# Patient Record
Sex: Female | Born: 1960
Health system: Southern US, Community
[De-identification: ages and names within clinical notes are randomized; demographics above are authoritative.]

## PROBLEM LIST (undated history)

## (undated) DIAGNOSIS — E079 Disorder of thyroid, unspecified: Secondary | ICD-10-CM

## (undated) DIAGNOSIS — F329 Major depressive disorder, single episode, unspecified: Secondary | ICD-10-CM

## (undated) DIAGNOSIS — F4322 Adjustment disorder with anxiety: Secondary | ICD-10-CM

## (undated) DIAGNOSIS — J309 Allergic rhinitis, unspecified: Secondary | ICD-10-CM

## (undated) DIAGNOSIS — D6489 Other specified anemias: Secondary | ICD-10-CM

## (undated) HISTORY — DX: Disorder of thyroid, unspecified: E07.9

## (undated) HISTORY — DX: Major depressive disorder, single episode, unspecified: F32.9

## (undated) HISTORY — DX: Allergic rhinitis, unspecified: J30.9

## (undated) HISTORY — PX: NASAL SEPTUM SURGERY: SHX37

## (undated) HISTORY — DX: Other specified anemias: D64.89

## (undated) HISTORY — DX: Adjustment disorder with anxiety: F43.22

---

## 2006-04-21 ENCOUNTER — Ambulatory Visit: Payer: Self-pay | Admitting: Internal Medicine

## 2006-04-21 LAB — CONVERTED CEMR LAB
ALT: 33 units/L (ref 0–40)
AST: 29 units/L (ref 0–37)
Albumin: 4.1 g/dL (ref 3.5–5.2)
Alkaline Phosphatase: 56 units/L (ref 39–117)
Basophils Relative: 0.2 % (ref 0.0–1.0)
GFR calc non Af Amer: 86 mL/min
Glomerular Filtration Rate, Af Am: 104 mL/min/{1.73_m2}
Glucose, Bld: 97 mg/dL (ref 70–99)
HCT: 37.9 % — ABNORMAL LOW (ref 39.0–52.0)
LDL Cholesterol: 79 mg/dL (ref 0–99)
MCV: 91.8 fL (ref 78.0–100.0)
Monocytes Absolute: 0.5 10*3/uL (ref 0.2–0.7)
Platelets: 313 10*3/uL (ref 150–400)
Potassium: 3.8 meq/L (ref 3.5–5.1)
RBC: 4.13 M/uL — ABNORMAL LOW (ref 4.22–5.81)
RDW: 11.7 % (ref 11.5–14.6)
Sodium: 133 meq/L — ABNORMAL LOW (ref 135–145)
Total Bilirubin: 1.1 mg/dL (ref 0.3–1.2)
Total Protein: 6.9 g/dL (ref 6.0–8.3)
VLDL: 16 mg/dL (ref 0–40)
WBC: 8.4 10*3/uL (ref 4.5–10.5)

## 2006-05-04 ENCOUNTER — Ambulatory Visit: Payer: Self-pay | Admitting: Internal Medicine

## 2006-06-29 ENCOUNTER — Ambulatory Visit: Payer: Self-pay | Admitting: Internal Medicine

## 2006-08-27 ENCOUNTER — Encounter: Admission: RE | Admit: 2006-08-27 | Discharge: 2006-08-27 | Payer: Self-pay | Admitting: Obstetrics and Gynecology

## 2006-12-16 ENCOUNTER — Ambulatory Visit: Payer: Self-pay | Admitting: Internal Medicine

## 2006-12-16 DIAGNOSIS — F329 Major depressive disorder, single episode, unspecified: Secondary | ICD-10-CM | POA: Insufficient documentation

## 2006-12-16 DIAGNOSIS — F3289 Other specified depressive episodes: Secondary | ICD-10-CM

## 2006-12-16 DIAGNOSIS — M549 Dorsalgia, unspecified: Secondary | ICD-10-CM | POA: Insufficient documentation

## 2006-12-16 DIAGNOSIS — D6489 Other specified anemias: Secondary | ICD-10-CM | POA: Insufficient documentation

## 2006-12-16 HISTORY — DX: Other specified anemias: D64.89

## 2006-12-16 HISTORY — DX: Other specified depressive episodes: F32.89

## 2006-12-16 HISTORY — DX: Major depressive disorder, single episode, unspecified: F32.9

## 2006-12-16 LAB — CONVERTED CEMR LAB
Basophils Relative: 0.8 % (ref 0.0–1.0)
Eosinophils Relative: 2.1 % (ref 0.0–5.0)
Hemoglobin: 14.2 g/dL (ref 13.0–17.0)
Monocytes Absolute: 0.8 10*3/uL — ABNORMAL HIGH (ref 0.2–0.7)
Monocytes Relative: 5.2 % (ref 3.0–11.0)
Platelets: 347 10*3/uL (ref 150–400)
RDW: 12.1 % (ref 11.5–14.6)
WBC: 14.5 10*3/uL — ABNORMAL HIGH (ref 4.5–10.5)

## 2007-08-24 ENCOUNTER — Ambulatory Visit: Payer: Self-pay | Admitting: Internal Medicine

## 2007-08-24 DIAGNOSIS — F4322 Adjustment disorder with anxiety: Secondary | ICD-10-CM

## 2007-08-24 HISTORY — DX: Adjustment disorder with anxiety: F43.22

## 2007-09-09 ENCOUNTER — Encounter: Admission: RE | Admit: 2007-09-09 | Discharge: 2007-09-09 | Payer: Self-pay | Admitting: Obstetrics and Gynecology

## 2008-06-27 ENCOUNTER — Ambulatory Visit: Payer: Self-pay | Admitting: Internal Medicine

## 2008-06-27 LAB — CONVERTED CEMR LAB: Rapid Strep: NEGATIVE

## 2008-06-30 ENCOUNTER — Telehealth: Payer: Self-pay | Admitting: Internal Medicine

## 2008-10-11 ENCOUNTER — Encounter: Admission: RE | Admit: 2008-10-11 | Discharge: 2008-10-11 | Payer: Self-pay | Admitting: Obstetrics and Gynecology

## 2009-01-11 ENCOUNTER — Ambulatory Visit: Payer: Self-pay | Admitting: Internal Medicine

## 2009-01-11 DIAGNOSIS — R071 Chest pain on breathing: Secondary | ICD-10-CM

## 2009-07-09 ENCOUNTER — Ambulatory Visit: Payer: Self-pay | Admitting: Internal Medicine

## 2009-07-09 LAB — CONVERTED CEMR LAB
Albumin: 4.4 g/dL (ref 3.5–5.2)
Alkaline Phosphatase: 59 units/L (ref 39–117)
Basophils Absolute: 0.1 10*3/uL (ref 0.0–0.1)
Bilirubin, Direct: 0 mg/dL (ref 0.0–0.3)
Blood in Urine, dipstick: NEGATIVE
CO2: 27 meq/L (ref 19–32)
Calcium: 9 mg/dL (ref 8.4–10.5)
Creatinine, Ser: 1.1 mg/dL (ref 0.4–1.2)
Eosinophils Absolute: 0.8 10*3/uL — ABNORMAL HIGH (ref 0.0–0.7)
Glucose, Bld: 92 mg/dL (ref 70–99)
Glucose, Urine, Semiquant: NEGATIVE
HDL: 49.9 mg/dL (ref >39.00)
Ketones, urine, test strip: NEGATIVE
Lymphocytes Relative: 22.8 % (ref 12.0–46.0)
MCHC: 33.6 g/dL (ref 30.0–36.0)
Neutro Abs: 8.5 10*3/uL — ABNORMAL HIGH (ref 1.4–7.7)
Neutrophils Relative %: 66 % (ref 43.0–77.0)
RDW: 11.9 % (ref 11.5–14.6)
Specific Gravity, Urine: <1.005
Triglycerides: 188 mg/dL — ABNORMAL HIGH (ref 0.0–149.0)
pH: 5.5

## 2009-07-13 ENCOUNTER — Ambulatory Visit: Payer: Self-pay | Admitting: Internal Medicine

## 2009-07-13 DIAGNOSIS — J309 Allergic rhinitis, unspecified: Secondary | ICD-10-CM

## 2009-07-13 DIAGNOSIS — D649 Anemia, unspecified: Secondary | ICD-10-CM | POA: Insufficient documentation

## 2009-07-13 HISTORY — DX: Allergic rhinitis, unspecified: J30.9

## 2010-06-04 NOTE — Assessment & Plan Note (Signed)
Summary: CPX // RS   Vital Signs:  Patient profile:   50 year old female Height:      62 inches Weight:      137 pounds BMI:     25.15 Temp:     99.9 degrees F oral BP sitting:   110 / 60  (right arm) Cuff size:   regular  Vitals Entered By: Duard Brady LPN (July 13, 2009 2:59 PM) CC: cpx - pap and mammo 08/2008 normal findings. Dr. Normand Sloop GYN , needs rf Is Patient Diabetic? No   CC:  cpx - pap and mammo 08/2008 normal findings. Dr. Normand Sloop GYN  and needs rf.  History of Present Illness: 9  old patient who is seen today for a wellness exam.  She has a history the mild anxiety disorder and a history of anemia.  Medical crowns also include seasonal allergic rhinitis.  She is done quite well.  She is scheduled for gynecologic follow-up next month.  No major concerns or complaints  Preventive Screening-Counseling & Management  Alcohol-Tobacco     Smoking Status: never  Caffeine-Diet-Exercise     Does Patient Exercise: yes  Allergies: 1)  ! Iodine  Past History:  Past Medical History: Allergic rhinitis Anemia-NOS  Past Surgical History: deviated septum Age 70  Family History: Reviewed history and no changes required. one brother-good health one sister- HTN  Social History: Reviewed history from 12/15/2006 and no changes required. Never Smoked Alcohol use-yes Drug use-no Clinical Social Worker Single Regular exercise-yes Does Patient Exercise:  yes  Review of Systems  The patient denies anorexia, fever, weight loss, weight gain, vision loss, decreased hearing, hoarseness, chest pain, syncope, dyspnea on exertion, peripheral edema, prolonged cough, headaches, hemoptysis, abdominal pain, melena, hematochezia, severe indigestion/heartburn, hematuria, incontinence, genital sores, muscle weakness, suspicious skin lesions, transient blindness, difficulty walking, depression, unusual weight change, abnormal bleeding, enlarged lymph nodes, angioedema, and breast  masses.    Physical Exam  General:  Well-developed,well-nourished,in no acute distress; alert,appropriate and cooperative throughout examination Head:  Normocephalic and atraumatic without obvious abnormalities. No apparent alopecia or balding. Eyes:  No corneal or conjunctival inflammation noted. EOMI. Perrla. Funduscopic exam benign, without hemorrhages, exudates or papilledema. Vision grossly normal. Ears:  External ear exam shows no significant lesions or deformities.  Otoscopic examination reveals clear canals, tympanic membranes are intact bilaterally without bulging, retraction, inflammation or discharge. Hearing is grossly normal bilaterally. Nose:  External nasal examination shows no deformity or inflammation. Nasal mucosa are pink and moist without lesions or exudates. Mouth:  Oral mucosa and oropharynx without lesions or exudates.  Teeth in good repair. Neck:  No deformities, masses, or tenderness noted. Chest Wall:  No deformities, masses, or tenderness noted. Breasts:  No mass, nodules, thickening, tenderness, bulging, retraction, inflamation, nipple discharge or skin changes noted.   Lungs:  Normal respiratory effort, chest expands symmetrically. Lungs are clear to auscultation, no crackles or wheezes. Heart:  Normal rate and regular rhythm. S1 and S2 normal without gallop, murmur, click, rub or other extra sounds. Abdomen:  Bowel sounds positive,abdomen soft and non-tender without masses, organomegaly or hernias noted. Msk:  No deformity or scoliosis noted of thoracic or lumbar spine.   Pulses:  R and L carotid,radial,femoral,dorsalis pedis and posterior tibial pulses are full and equal bilaterally Extremities:  No clubbing, cyanosis, edema, or deformity noted with normal full range of motion of all joints.   Neurologic:  alert & oriented X3, strength normal in all extremities, sensation intact to light touch, gait  normal, and DTRs symmetrical and normal.  alert & oriented X3,  strength normal in all extremities, sensation intact to light touch, gait normal, and DTRs symmetrical and normal.   Skin:  Intact without suspicious lesions or rashes Cervical Nodes:  No lymphadenopathy noted Axillary Nodes:  No palpable lymphadenopathy Inguinal Nodes:  No significant adenopathy Psych:  Cognition and judgment appear intact. Alert and cooperative with normal attention span and concentration. No apparent delusions, illusions, hallucinations   Impression & Recommendations:  Problem # 1:  Preventive Health Care (ICD-V70.0)  Complete Medication List: 1)  Singulair 10 Mg Tabs (Montelukast sodium) .... Take 1 tablet by mouth once a day 2)  Lorazepam 0.5 Mg Tabs (Lorazepam) .... One twice daily as needed 3)  Fluticasone Propionate 50 Mcg/act Susp (Fluticasone propionate) .... Use  daily  Patient Instructions: 1)  Please schedule a follow-up appointment as needed. 2)  It is important that you exercise regularly at least 20 minutes 5 times a week. If you develop chest pain, have severe difficulty breathing, or feel very tired , stop exercising immediately and seek medical attention. 3)  annual gynecologic exam Prescriptions: FLUTICASONE PROPIONATE 50 MCG/ACT SUSP (FLUTICASONE PROPIONATE) use  daily  #2 x 6   Entered and Authorized by:   Gordy Savers  MD   Signed by:   Gordy Savers  MD on 07/13/2009   Method used:   Print then Give to Patient   RxID:   [id number] LORAZEPAM 0.5 MG  TABS (LORAZEPAM) one twice daily as needed  #90 x 3   Entered and Authorized by:   Gordy Savers  MD   Signed by:   Gordy Savers  MD on 07/13/2009   Method used:   Print then Give to Patient   RxID:   [id number] SINGULAIR 10 MG  TABS (MONTELUKAST SODIUM) Take 1 tablet by mouth once a day  #90 x 6   Entered and Authorized by:   Gordy Savers  MD   Signed by:   Gordy Savers  MD on 07/13/2009   Method used:   Print then Give to Patient   RxID:    [id number]          Appended Document: CPX // RS     Vitals Entered By: Duard Brady LPN (July 13, 2009 3:47 PM)  Allergies: 1)  ! Iodine   Complete Medication List: 1)  Singulair 10 Mg Tabs (Montelukast sodium) .... Take 1 tablet by mouth once a day 2)  Lorazepam 0.5 Mg Tabs (Lorazepam) .... One twice daily as needed 3)  Fluticasone Propionate 50 Mcg/act Susp (Fluticasone propionate) .... Use  daily  Other Orders: EKG w/ Interpretation (93000)

## 2010-09-20 NOTE — Assessment & Plan Note (Signed)
[name]                            [facility] OFFICE NOTE   Nicole Ferrell, Nicole Ferrell                        MRN:          [id number]  DATE:05/04/2006                            DOB:          07-01-1960    The patient is a 50 year old female seen today to establish with our  practice.  She has enjoyed excellent health.  She takes Singulair for  perennial rhinitis.  She has had some outpatient wisdom teeth  extractions, and otherwise past medical history is unremarkable.   FAMILY HISTORY:  Both parents are followed here.  Mother with severe  psychiatric difficulties.   EXAM:  Healthy-appearing female in no acute distress.  Blood pressure is low-normal.  SKIN:  Negative.  FUNDI, EARS, NOSE, AND THROAT:  Clear.  NECK:  No adenopathy.  CHEST:  Clear.  BREASTS:  Negative.  CARDIOVASCULAR:  Normal heart sounds.  No murmur.  ABDOMEN:  Benign.  EXTREMITIES:  Negative.  Full peripheral pulses.   IMPRESSION:  1. Unremarkable clinical exam.  2. Mild anemia.   DISPOSITION:  Iron supplementation was encouraged, as well as a more  active exercise program.  She does complain of some minimal fatigue.  If  this is unimproved in 4 to 6 weeks, has been asked to re-contact the  office.     Gordy Savers, MD  Electronically Signed    PFK/MedQ  DD: 05/04/2006  DT: 05/04/2006  Job #: [id number]

## 2011-06-23 ENCOUNTER — Encounter: Payer: Self-pay | Admitting: Internal Medicine

## 2011-06-23 ENCOUNTER — Ambulatory Visit (INDEPENDENT_AMBULATORY_CARE_PROVIDER_SITE_OTHER): Payer: BC Managed Care – PPO | Admitting: Internal Medicine

## 2011-06-23 VITALS — BP 120/80 | Temp 98.7°F | Ht 64.0 in | Wt 141.0 lb

## 2011-06-23 DIAGNOSIS — L03211 Cellulitis of face: Secondary | ICD-10-CM

## 2011-06-23 DIAGNOSIS — L0201 Cutaneous abscess of face: Secondary | ICD-10-CM

## 2011-06-23 DIAGNOSIS — R5383 Other fatigue: Secondary | ICD-10-CM

## 2011-06-23 DIAGNOSIS — Z789 Other specified health status: Secondary | ICD-10-CM

## 2011-06-23 DIAGNOSIS — R5381 Other malaise: Secondary | ICD-10-CM

## 2011-06-23 MED ORDER — CEPHALEXIN 500 MG PO CAPS: 500.0000 mg | ORAL_CAPSULE | Freq: Four times a day (QID) | ORAL | Status: AC

## 2011-06-23 MED ORDER — LORAZEPAM 0.5 MG PO TABS: 0.5000 mg | ORAL_TABLET | Freq: Two times a day (BID) | ORAL | Status: DC | PRN

## 2011-06-23 NOTE — Progress Notes (Signed)
  Subjective:    Patient ID: Nicole Ferrell, female    DOB: Aug 23, 1960, 51 y.o.   MRN: [id number]  HPI  51 year old patient who presents with a four-day history of increasing pain erythema and swelling involving the left eyebrow area. She also complains of chronic fatigue. She did have a gynecologic check this past summer    Review of Systems  Skin: Positive for rash.       Objective:   Physical Exam  Constitutional: She appears well-developed and well-nourished. No distress.  Skin: Rash noted.       A patchy area of induration erythema and slight tenderness involving the upper inner eyebrow region on the left          Assessment & Plan:   Early facial cellulitis. We'll treat with cephalexin. Local wound care discussed. She will call if there is any clinical worsening

## 2011-06-23 NOTE — Patient Instructions (Addendum)
Limit your sodium (Salt) intake    It is important that you exercise regularly, at least 20 minutes 3 to 4 times per week.  If you develop chest pain or shortness of breath seek  medical attention.  Take your antibiotic as prescribed until ALL of it is gone, but stop if you develop a rash, swelling, or any side effects of the medication.  Contact our office as soon as possible if  there are side effects of the medication.   Call or return to clinic prn if these symptoms worsen or fail to improve as anticipated.

## 2011-06-24 LAB — CBC WITH DIFFERENTIAL/PLATELET
Basophils Absolute: 0 10*3/uL (ref 0.0–0.1)
Eosinophils Absolute: 0.3 10*3/uL (ref 0.0–0.7)
MCHC: 33.9 g/dL (ref 30.0–36.0)
MCV: 89.8 fl (ref 78.0–100.0)
Monocytes Absolute: 0.4 10*3/uL (ref 0.1–1.0)
Neutrophils Relative %: 62.3 % (ref 43.0–77.0)
Platelets: 285 10*3/uL (ref 150.0–400.0)
RDW: 13.2 % (ref 11.5–14.6)
WBC: 6.7 10*3/uL (ref 4.5–10.5)

## 2011-06-24 LAB — COMPREHENSIVE METABOLIC PANEL
ALT: 28 U/L (ref 0–35)
Albumin: 4.3 g/dL (ref 3.5–5.2)
Alkaline Phosphatase: 87 U/L (ref 39–117)
Glucose, Bld: 124 mg/dL — ABNORMAL HIGH (ref 70–99)
Potassium: 3.4 mEq/L — ABNORMAL LOW (ref 3.5–5.1)
Sodium: 136 mEq/L (ref 135–145)
Total Protein: 7.4 g/dL (ref 6.0–8.3)

## 2011-06-26 ENCOUNTER — Encounter: Payer: Self-pay | Admitting: Internal Medicine

## 2011-06-26 ENCOUNTER — Ambulatory Visit (INDEPENDENT_AMBULATORY_CARE_PROVIDER_SITE_OTHER): Payer: BC Managed Care – PPO | Admitting: Internal Medicine

## 2011-06-26 VITALS — BP 120/70 | Temp 98.2°F | Wt 141.0 lb

## 2011-06-26 DIAGNOSIS — H05019 Cellulitis of unspecified orbit: Secondary | ICD-10-CM

## 2011-06-26 NOTE — Patient Instructions (Signed)
Call or return to clinic prn if these symptoms worsen or fail to improve as anticipated.

## 2011-06-26 NOTE — Progress Notes (Signed)
  Subjective:    Patient ID: Nicole Ferrell, female    DOB: Jan 08, 1961, 51 y.o.   MRN: [id number]  HPI  51 year old patient who was treated earlier for a left periorbital cellulitis. She is on Keflex which she continues to tolerate well she was concerned about her clinical response. She is still having some headache fatigue and lack of energy. There's been no fever.    Review of Systems  Constitutional: Positive for fatigue. Negative for fever and chills.  Skin: Positive for rash.  Neurological: Positive for weakness.       Objective:   Physical Exam  Skin:       The area above her left eyebrow was much improved there is no further erythema or induration. 2 dry  crusted lesions were still present          Assessment & Plan:   Resolving periorbital cellulitis. Will complete antibiotic therapy we'll call if there is any clinical deterioration

## 2011-10-22 ENCOUNTER — Other Ambulatory Visit: Payer: Self-pay | Admitting: Internal Medicine

## 2011-10-28 ENCOUNTER — Other Ambulatory Visit: Payer: Self-pay | Admitting: Obstetrics and Gynecology

## 2011-10-28 DIAGNOSIS — Z1231 Encounter for screening mammogram for malignant neoplasm of breast: Secondary | ICD-10-CM

## 2011-11-03 ENCOUNTER — Ambulatory Visit
Admission: RE | Admit: 2011-11-03 | Discharge: 2011-11-03 | Disposition: A | Discharge status: Home / Self Care | Payer: BC Managed Care – PPO | Source: Ambulatory Visit | Attending: Obstetrics and Gynecology | Admitting: Obstetrics and Gynecology

## 2011-11-03 DIAGNOSIS — Z1231 Encounter for screening mammogram for malignant neoplasm of breast: Secondary | ICD-10-CM

## 2011-11-10 ENCOUNTER — Encounter: Payer: Self-pay | Admitting: Obstetrics and Gynecology

## 2011-11-21 ENCOUNTER — Ambulatory Visit (INDEPENDENT_AMBULATORY_CARE_PROVIDER_SITE_OTHER): Payer: BC Managed Care – PPO | Admitting: Internal Medicine

## 2011-11-21 ENCOUNTER — Encounter: Payer: Self-pay | Admitting: Internal Medicine

## 2011-11-21 ENCOUNTER — Ambulatory Visit: Payer: BC Managed Care – PPO | Admitting: Internal Medicine

## 2011-11-21 VITALS — BP 102/70 | Wt 135.0 lb

## 2011-11-21 DIAGNOSIS — J309 Allergic rhinitis, unspecified: Secondary | ICD-10-CM

## 2011-11-21 DIAGNOSIS — J069 Acute upper respiratory infection, unspecified: Secondary | ICD-10-CM

## 2011-11-21 MED ORDER — HYDROCODONE-HOMATROPINE 5-1.5 MG/5ML PO SYRP: 5.0000 mL | ORAL_SOLUTION | Freq: Four times a day (QID) | ORAL | Status: AC | PRN

## 2011-11-21 NOTE — Progress Notes (Signed)
  Subjective:    Patient ID: Nicole Nicole, female    DOB: [birth year], 51 y.o.   MRN: [id number]  HPI  51 year old patient has a history of allergic rhinitis. She presents with a chief complaint of chronic cough now of 6 weeks duration. She was seen in urgent care 2 weeks ago and treated with azithromycin and prednisone. She  still has chronic refractory nonproductive cough. In general she feels well. She does have a history of allergic rhinitis and is on Flonase antihistamines and Singulair. No fever. No nocturnal cough    Review of Systems  Constitutional: Negative.   HENT: Negative for hearing loss, congestion, sore throat, rhinorrhea, dental problem, sinus pressure and tinnitus.   Eyes: Negative for pain, discharge and visual disturbance.  Respiratory: Positive for cough. Negative for shortness of breath.   Cardiovascular: Negative for chest pain, palpitations and leg swelling.  Gastrointestinal: Negative for nausea, vomiting, abdominal pain, diarrhea, constipation, blood in stool and abdominal distention.  Genitourinary: Negative for dysuria, urgency, frequency, hematuria, flank pain, vaginal bleeding, vaginal discharge, difficulty urinating, vaginal pain and pelvic pain.  Musculoskeletal: Negative for joint swelling, arthralgias and gait problem.  Skin: Negative for rash.  Neurological: Negative for dizziness, syncope, speech difficulty, weakness, numbness and headaches.  Hematological: Negative for adenopathy.  Psychiatric/Behavioral: Negative for behavioral problems, dysphoric mood and agitation. The patient is not nervous/anxious.        Objective:   Physical Exam  Constitutional: She is oriented to person, place, and time. She appears well-developed and well-nourished.  HENT:  Head: Normocephalic.  Right Ear: External ear normal.  Left Ear: External ear normal.  Mouth/Throat: Oropharynx is clear and moist.  Eyes: Conjunctivae and EOM are normal. Pupils are equal, round, and  reactive to light.  Neck: Normal range of motion. Neck supple. No thyromegaly present.  Cardiovascular: Normal rate, regular rhythm, normal heart sounds and intact distal pulses.   Pulmonary/Chest: Effort normal and breath sounds normal.  Abdominal: Soft. Bowel sounds are normal. She exhibits no mass. There is no tenderness.  Musculoskeletal: Normal range of motion.  Lymphadenopathy:    She has no cervical adenopathy.  Neurological: She is alert and oriented to person, place, and time.  Skin: Skin is warm and dry. No rash noted.  Psychiatric: She has a normal mood and affect. Her behavior is normal.          Assessment & Plan:   Viral URI with cough. Will treat symptomatically Allergic rhinitis. Will continue maintenance drugs  Return if unimproved

## 2011-11-21 NOTE — Patient Instructions (Signed)
  Advair one inhalation twice daily Take  cough medicine as directed every 6 hours  Get plenty of rest, Drink lots of  clear liquids

## 2011-12-04 ENCOUNTER — Telehealth: Payer: Self-pay | Admitting: Internal Medicine

## 2011-12-04 NOTE — Telephone Encounter (Signed)
Caller: Nicole Ferrell/Patient; PCP: Eleonore Chiquito; CB#: [phone]; ; ; Call regarding Pain in Right Side; 8/1  she has been having cough and bronchitis sxs or about 5 wks  she said she has been coughing very hard and now has pain right side of chest, does worsen with cough, movment.  All emergent sxs per Chest Pain protocols R/O except for pain brought on by or made worse by movment or change of position and not previously evaluated   Home care advice given   She can't make appt tomorrow and says [facility] to expensive  She will try home care and if not improving will see about Sat appt.

## 2012-03-19 ENCOUNTER — Ambulatory Visit (INDEPENDENT_AMBULATORY_CARE_PROVIDER_SITE_OTHER): Payer: BC Managed Care – PPO | Admitting: Obstetrics and Gynecology

## 2012-03-19 ENCOUNTER — Encounter: Payer: Self-pay | Admitting: Obstetrics and Gynecology

## 2012-03-19 VITALS — BP 100/60 | Ht 64.0 in | Wt 128.0 lb

## 2012-03-19 DIAGNOSIS — M549 Dorsalgia, unspecified: Secondary | ICD-10-CM

## 2012-03-19 DIAGNOSIS — Z01419 Encounter for gynecological examination (general) (routine) without abnormal findings: Secondary | ICD-10-CM

## 2012-03-19 DIAGNOSIS — N949 Unspecified condition associated with female genital organs and menstrual cycle: Secondary | ICD-10-CM

## 2012-03-19 DIAGNOSIS — R102 Pelvic and perineal pain unspecified side: Secondary | ICD-10-CM

## 2012-03-19 DIAGNOSIS — Z124 Encounter for screening for malignant neoplasm of cervix: Secondary | ICD-10-CM

## 2012-03-19 DIAGNOSIS — N926 Irregular menstruation, unspecified: Secondary | ICD-10-CM

## 2012-03-19 LAB — POCT URINE PREGNANCY: Preg Test, Ur: NEGATIVE

## 2012-03-19 NOTE — Progress Notes (Addendum)
Last Pap: 12/2010 WNL: Yes Regular Periods:no Contraception: n/a  Monthly Breast exam:no Tetanus<1yrs:yes Nl.Bladder Function:yes Daily BMs:yes Healthy Diet:yes Calcium:yes Mammogram:yes Date of Mammogram: 2013 wnl per pt Exercise:yes Have often Exercise: occ Seatbelt: yes Abuse at home: no Stressful work:no Sigmoid-colonoscopy: n/a Bone Density: No PCP: Dr. Amador Cunas Change in PMH: no chnge Change in FMH:no change  Pt c/o pelvic pain right side.  She has had back pain that radiates to her lower right quadrant.  No irregular bleeding.  She has had some relief exercising with an exercise physiologist.  It has been going on for one month.  She also has a cyst on her vagina BP 100/60  Ht 5\' 4"  (1.626 m)  Wt 128 lb (58.06 kg)  BMI 21.97 kg/m2  LMP 12/17/2011 Physical Examination: General appearance - alert, well appearing, and in no distress Chest - clear to auscultation, no wheezes, rales or rhonchi, symmetric air entry Heart - normal rate and regular rhythm Abdomen - soft, nontender, nondistended, no masses or organomegaly Breasts - breasts appear normal, no suspicious masses, no skin or nipple changes or axillary nodes Pelvic - VULVA: normal appearing vulva with no masses, tenderness or lesions, VAGINA: normal appearing vagina with normal color and discharge, no lesions, small inclusion cyst on left side of labia minora CERVIX: normal appearing cervix without discharge or lesions, UTERUS: uterus is normal size, shape, consistency and nontender, ADNEXA: normal adnexa in size, nontender and no masses, tenderness right Rectal - declined by the patient Back exam - full range of motion, no tenderness, palpable spasm or pain on motion Musculoskeletal - no joint tenderness, deformity or swelling Extremities - peripheral pulses normal, no pedal edema, no clubbing or cyanosis AEX Back pain Pelvic pain Vaginal cyst.  Pt desires obs for now.  If it increases in size I will remove  it Referred to Dr Althea Charon [email or web address]  Pt soing HRT with another MD. She is followed closely by him

## 2012-03-19 NOTE — Patient Instructions (Signed)
Back Pain, Adult Low back pain is very common. About 1 in 5 people have back pain.The cause of low back pain is rarely dangerous. The pain often gets better over time.About half of people with a sudden onset of back pain feel better in just 2 weeks. About 8 in 10 people feel better by 6 weeks.  CAUSES Some common causes of back pain include:  Strain of the muscles or ligaments supporting the spine.  Wear and tear (degeneration) of the spinal discs.  Arthritis.  Direct injury to the back. DIAGNOSIS Most of the time, the direct cause of low back pain is not known.However, back pain can be treated effectively even when the exact cause of the pain is unknown.Answering your caregiver's questions about your overall health and symptoms is one of the most accurate ways to make sure the cause of your pain is not dangerous. If your caregiver needs more information, he or she may order lab work or imaging tests (X-rays or MRIs).However, even if imaging tests show changes in your back, this usually does not require surgery. HOME CARE INSTRUCTIONS For many people, back pain returns.Since low back pain is rarely dangerous, it is often a condition that people can learn to manageon their own.   Remain active. It is stressful on the back to sit or stand in one place. Do not sit, drive, or stand in one place for more than 30 minutes at a time. Take short walks on level surfaces as soon as pain allows.Try to increase the length of time you walk each day.  Do not stay in bed.Resting more than 1 or 2 days can delay your recovery.  Do not avoid exercise or work.Your body is made to move.It is not dangerous to be active, even though your back may hurt.Your back will likely heal faster if you return to being active before your pain is gone.  Pay attention to your body when you bend and lift. Many people have less discomfortwhen lifting if they bend their knees, keep the load close to their bodies,and  avoid twisting. Often, the most comfortable positions are those that put less stress on your recovering back.  Find a comfortable position to sleep. Use a firm mattress and lie on your side with your knees slightly bent. If you lie on your back, put a pillow under your knees.  Only take over-the-counter or prescription medicines as directed by your caregiver. Over-the-counter medicines to reduce pain and inflammation are often the most helpful.Your caregiver may prescribe muscle relaxant drugs.These medicines help dull your pain so you can more quickly return to your normal activities and healthy exercise.  Put ice on the injured area.  Put ice in a plastic bag.  Place a towel between your skin and the bag.  Leave the ice on for 15 to 20 minutes, 3 to 4 times a day for the first 2 to 3 days. After that, ice and heat may be alternated to reduce pain and spasms.  Ask your caregiver about trying back exercises and gentle massage. This may be of some benefit.  Avoid feeling anxious or stressed.Stress increases muscle tension and can worsen back pain.It is important to recognize when you are anxious or stressed and learn ways to manage it.Exercise is a great option. SEEK MEDICAL CARE IF:  You have pain that is not relieved with rest or medicine.  You have pain that does not improve in 1 week.  You have new symptoms.  You are generally   not feeling well. SEEK IMMEDIATE MEDICAL CARE IF:   You have pain that radiates from your back into your legs.  You develop new bowel or bladder control problems.  You have unusual weakness or numbness in your arms or legs.  You develop nausea or vomiting.  You develop abdominal pain.  You feel faint. Document Released: 04/21/2005 Document Revised: 10/21/2011 Document Reviewed: 09/09/2010 Exit[employer] Patient Information 2013 [employer].  

## 2012-03-22 LAB — PAP IG W/ RFLX HPV ASCU

## 2012-04-06 ENCOUNTER — Other Ambulatory Visit: Payer: BC Managed Care – PPO

## 2012-04-06 ENCOUNTER — Encounter: Payer: BC Managed Care – PPO | Admitting: Obstetrics and Gynecology

## 2012-04-26 ENCOUNTER — Encounter: Payer: Self-pay | Admitting: Obstetrics and Gynecology

## 2012-04-26 ENCOUNTER — Other Ambulatory Visit: Payer: Self-pay | Admitting: Obstetrics and Gynecology

## 2012-04-26 ENCOUNTER — Ambulatory Visit (INDEPENDENT_AMBULATORY_CARE_PROVIDER_SITE_OTHER): Payer: BC Managed Care – PPO | Admitting: Obstetrics and Gynecology

## 2012-04-26 ENCOUNTER — Ambulatory Visit (INDEPENDENT_AMBULATORY_CARE_PROVIDER_SITE_OTHER): Payer: BC Managed Care – PPO

## 2012-04-26 VITALS — BP 106/70 | Resp 16 | Ht 63.0 in | Wt 126.0 lb

## 2012-04-26 DIAGNOSIS — D259 Leiomyoma of uterus, unspecified: Secondary | ICD-10-CM

## 2012-04-26 DIAGNOSIS — R102 Pelvic and perineal pain: Secondary | ICD-10-CM

## 2012-04-26 DIAGNOSIS — D219 Benign neoplasm of connective and other soft tissue, unspecified: Secondary | ICD-10-CM

## 2012-04-26 DIAGNOSIS — N949 Unspecified condition associated with female genital organs and menstrual cycle: Secondary | ICD-10-CM

## 2012-04-26 NOTE — Progress Notes (Signed)
Pt here for f/u.  States her pain ha improved with PT [place] 6.79 cm by 4.03 cm Normal adnexa B Post 3.11 cm Pelvic pain improved Most likely musculosketetal in origin Pt desires obs for the fibroid

## 2012-04-26 NOTE — Patient Instructions (Signed)
Fibroids Fibroids are lumps (tumors) that can occur any place in a woman's body. These lumps are not cancerous. Fibroids vary in size, weight, and where they grow. HOME CARE  Do not take aspirin.  Write down the number of pads or tampons you use during your period. Tell your doctor. This can help determine the best treatment for you. GET HELP RIGHT AWAY IF:  You have pain in your lower belly (abdomen) that is not helped with medicine.  You have cramps that are not helped with medicine.  You have more bleeding between or during your period.  You feel lightheaded or pass out (faint).  Your lower belly pain gets worse. MAKE SURE YOU:  Understand these instructions.  Will watch your condition.  Will get help right away if you are not doing well or get worse. Document Released: 05/24/2010 Document Revised: 07/14/2011 Document Reviewed: 05/24/2010 Exit[employer] Patient Information 2013 [employer].  

## 2012-09-13 ENCOUNTER — Other Ambulatory Visit: Payer: Self-pay | Admitting: Internal Medicine

## 2014-01-13 ENCOUNTER — Ambulatory Visit (INDEPENDENT_AMBULATORY_CARE_PROVIDER_SITE_OTHER): Payer: BC Managed Care – PPO

## 2014-01-13 ENCOUNTER — Ambulatory Visit (INDEPENDENT_AMBULATORY_CARE_PROVIDER_SITE_OTHER): Payer: BC Managed Care – PPO | Admitting: Sports Medicine

## 2014-01-13 ENCOUNTER — Encounter: Payer: Self-pay | Admitting: Sports Medicine

## 2014-01-13 VITALS — BP 113/71 | HR 77 | Ht 63.0 in | Wt 126.0 lb

## 2014-01-13 DIAGNOSIS — M549 Dorsalgia, unspecified: Secondary | ICD-10-CM

## 2014-01-13 DIAGNOSIS — M5412 Radiculopathy, cervical region: Secondary | ICD-10-CM

## 2014-01-13 DIAGNOSIS — M5030 Other cervical disc degeneration, unspecified cervical region: Secondary | ICD-10-CM

## 2014-01-13 MED ORDER — PREDNISONE 50 MG PO TABS: ORAL_TABLET | ORAL | Status: DC

## 2014-01-13 NOTE — Progress Notes (Signed)
   Subjective:    I'm seeing this patient as a consultation for:  Dr. Mylinda Latina and Dr. Bluford Kaufmann  CC: Left periscapular pain  HPI: This is a very pleasant 53 year old female with a several week history of pain and burning around her left periscapular region, moderate, persistent, some sensations of the deltoid but no pain going down the arm or numbness or tingling. Her neck is persistently stiff and she has significant difficulty with down gaze.  Past medical history, Surgical history, Family history not pertinant except as noted below, Social history, Allergies, and medications have been entered into the medical record, reviewed, and no changes needed.   Review of Systems: No headache, visual changes, nausea, vomiting, diarrhea, constipation, dizziness, abdominal pain, skin rash, fevers, chills, night sweats, weight loss, swollen lymph nodes, body aches, joint swelling, muscle aches, chest pain, shortness of breath, mood changes, visual or auditory hallucinations.   Objective:   General: Well Developed, well nourished, and in no acute distress.  Neuro/Psych: Alert and oriented x3, extra-ocular muscles intact, able to move all 4 extremities, sensation grossly intact. Skin: Warm and dry, no rashes noted.  Respiratory: Not using accessory muscles, speaking in full sentences, trachea midline.  Cardiovascular: Pulses palpable, no extremity edema. Abdomen: Does not appear distended. Neck: Negative spurling's Full neck range of motion Grip strength and sensation normal in bilateral hands Strength good C4 to T1 distribution No sensory change to C4 to T1 Reflexes normal There is also a discrete area of tenderness around T7 spinous process.  Cervical and thoracic spine x-ray show multilevel degenerative changes.  Impression and Recommendations:   This case required medical decision making of moderate complexity.

## 2014-01-13 NOTE — Assessment & Plan Note (Signed)
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 Prednisone formal PT. Over-the-counter NSAIDs. cervical and thoracic spine x-rays, bone density test.

## 2014-01-18 ENCOUNTER — Ambulatory Visit: Payer: BC Managed Care – PPO | Attending: Sports Medicine

## 2014-01-18 DIAGNOSIS — M542 Cervicalgia: Secondary | ICD-10-CM | POA: Diagnosis not present

## 2014-01-18 DIAGNOSIS — R293 Abnormal posture: Secondary | ICD-10-CM | POA: Diagnosis not present

## 2014-01-18 DIAGNOSIS — R5381 Other malaise: Secondary | ICD-10-CM | POA: Diagnosis not present

## 2014-01-18 DIAGNOSIS — M546 Pain in thoracic spine: Secondary | ICD-10-CM | POA: Insufficient documentation

## 2014-01-18 DIAGNOSIS — IMO0001 Reserved for inherently not codable concepts without codable children: Secondary | ICD-10-CM | POA: Diagnosis present

## 2014-01-24 ENCOUNTER — Ambulatory Visit: Payer: BC Managed Care – PPO | Admitting: Physical Therapy

## 2014-01-24 DIAGNOSIS — IMO0001 Reserved for inherently not codable concepts without codable children: Secondary | ICD-10-CM | POA: Diagnosis not present

## 2014-01-25 ENCOUNTER — Other Ambulatory Visit: Payer: BC Managed Care – PPO

## 2014-01-27 ENCOUNTER — Ambulatory Visit: Payer: BC Managed Care – PPO | Admitting: Physical Therapy

## 2014-01-27 DIAGNOSIS — IMO0001 Reserved for inherently not codable concepts without codable children: Secondary | ICD-10-CM | POA: Diagnosis not present

## 2014-02-01 ENCOUNTER — Other Ambulatory Visit: Payer: BC Managed Care – PPO

## 2014-02-03 ENCOUNTER — Ambulatory Visit: Payer: BC Managed Care – PPO | Attending: Sports Medicine | Admitting: Physical Therapy

## 2014-02-03 DIAGNOSIS — M546 Pain in thoracic spine: Secondary | ICD-10-CM | POA: Insufficient documentation

## 2014-02-03 DIAGNOSIS — R293 Abnormal posture: Secondary | ICD-10-CM | POA: Diagnosis not present

## 2014-02-03 DIAGNOSIS — M542 Cervicalgia: Secondary | ICD-10-CM | POA: Insufficient documentation

## 2014-02-03 DIAGNOSIS — R5381 Other malaise: Secondary | ICD-10-CM | POA: Diagnosis not present

## 2014-02-03 DIAGNOSIS — Z5189 Encounter for other specified aftercare: Secondary | ICD-10-CM | POA: Diagnosis present

## 2014-02-07 ENCOUNTER — Ambulatory Visit: Payer: BC Managed Care – PPO | Admitting: Physical Therapy

## 2014-02-07 DIAGNOSIS — Z5189 Encounter for other specified aftercare: Secondary | ICD-10-CM | POA: Diagnosis not present

## 2014-02-08 ENCOUNTER — Other Ambulatory Visit: Payer: BC Managed Care – PPO

## 2014-02-10 ENCOUNTER — Ambulatory Visit: Payer: BC Managed Care – PPO | Admitting: Physical Therapy

## 2014-02-10 ENCOUNTER — Ambulatory Visit: Payer: BC Managed Care – PPO | Admitting: Sports Medicine

## 2014-02-10 ENCOUNTER — Encounter: Payer: Self-pay | Admitting: Podiatry

## 2014-02-10 ENCOUNTER — Ambulatory Visit (INDEPENDENT_AMBULATORY_CARE_PROVIDER_SITE_OTHER): Payer: BC Managed Care – PPO | Admitting: Podiatry

## 2014-02-10 VITALS — BP 142/85 | HR 89 | Resp 14 | Ht 64.0 in | Wt 125.0 lb

## 2014-02-10 DIAGNOSIS — Z5189 Encounter for other specified aftercare: Secondary | ICD-10-CM | POA: Diagnosis not present

## 2014-02-10 DIAGNOSIS — L03116 Cellulitis of left lower limb: Secondary | ICD-10-CM

## 2014-02-10 DIAGNOSIS — L03032 Cellulitis of left toe: Secondary | ICD-10-CM

## 2014-02-10 MED ORDER — CEPHALEXIN 500 MG PO CAPS: 500.0000 mg | ORAL_CAPSULE | Freq: Three times a day (TID) | ORAL | Status: DC

## 2014-02-10 NOTE — Patient Instructions (Signed)

## 2014-02-10 NOTE — Progress Notes (Signed)
   Subjective:    Patient ID: Nicole Ferrell, female    DOB: 1960-07-09, 53 y.o.   MRN: [id number]  HPI Comments: Pt presents with red, swollen left 1st lateral toenail base, after a pedicure last weekend.  Ms. Vaquera, 53 year old female presents the office today with complaints of left first toenail infection. She states that she had a pedicure approximately 6 days ago and since then has had pain. She noticed over the last couple of days that she has had increased redness and drainage from around the nail. Also noticed a small amount of purulence. Denies any systemic complaints such as fevers, chills, nausea, vomiting. No other complaints at this time.     Review of Systems     Objective:   Physical Exam AAO x3, NAD DP/PT pulses palpable bilaterally, CRT less than 3 seconds.  Protective sensation intact with Derrel Nip monofilament, vibratory sensation intact, Achilles tendon reflex intact. Edema and erythema overlying the lateral aspect of the left hallux nail. There is purulence expressed from the lateral nail border. There is erythema extending to the level of the MTPJ and over the dorsal foot to the 3rd MTPJ area. There is no streaking. No fluctuance, crepitus. No pain with range of motion of the MPJs. MMT 5/5, ROM WNL No calf pain, swelling, warmth.      Assessment & Plan:  53 year old female with left lateral hallux paronychia, cellulitis to -Conservative versus surgical treatment discussed including alternatives, risks, complications. -At this time due to the infection and purulence expressed recommended a partial nail avulsion. Risks and complications discussed the patient for which she understands and elects to proceed with the procedure. A total of 2.5 cc of a one-to-one mixture of 2% lidocaine plain and 0.5% Marcaine plain was infiltrated in a hallux block fashion. Once anesthetized the skin was then prepped in sterile fashion. Next the lateral border of the hallux nail  was then sharply excised making sure to remove the entire offending nail border. There is noted to be a large amount of purulence expressed from the lateral nail border. Once the nail was ensured to be removed the area was then further explored in debrided. The area was then irrigated. No further purulence was identified. The remainder of the nail was intact and no purulence was expressed from the medial nail border. Silvadene was applied followed by a dry sterile dressing. A tourniquet was used and after application of the dressing it was removed and there is noted to be an immediate capillary refill time noted to the digit. Patient tolerated the procedure well without complications. -Post procedure instructions discussed with the patient for which he verbally understood. -Prescribed Keflex. -Monitor for any signs or symptoms of worsening infection and directed to call the office immediately if any are to occur or go directly to the emergency room. -Followup in 1 week or sooner if any problems are to arise or any change in symptoms. In the meantime call the office with any questions, concerns.

## 2014-02-14 ENCOUNTER — Ambulatory Visit: Payer: BC Managed Care – PPO | Admitting: Sports Medicine

## 2014-02-15 ENCOUNTER — Other Ambulatory Visit: Payer: BC Managed Care – PPO

## 2014-02-15 ENCOUNTER — Ambulatory Visit: Payer: BC Managed Care – PPO | Admitting: Physical Therapy

## 2014-02-15 DIAGNOSIS — Z5189 Encounter for other specified aftercare: Secondary | ICD-10-CM | POA: Diagnosis not present

## 2014-02-17 ENCOUNTER — Encounter: Payer: Self-pay | Admitting: Podiatry

## 2014-02-17 ENCOUNTER — Ambulatory Visit (INDEPENDENT_AMBULATORY_CARE_PROVIDER_SITE_OTHER): Payer: BC Managed Care – PPO | Admitting: Podiatry

## 2014-02-17 ENCOUNTER — Ambulatory Visit: Payer: BC Managed Care – PPO | Admitting: Physical Therapy

## 2014-02-17 VITALS — BP 128/67 | HR 78 | Resp 16

## 2014-02-17 DIAGNOSIS — Z5189 Encounter for other specified aftercare: Secondary | ICD-10-CM | POA: Diagnosis not present

## 2014-02-17 DIAGNOSIS — L03116 Cellulitis of left lower limb: Secondary | ICD-10-CM

## 2014-02-17 DIAGNOSIS — L03032 Cellulitis of left toe: Secondary | ICD-10-CM

## 2014-02-17 NOTE — Patient Instructions (Signed)
Continue soaking twice a day Epson salt soaks followed by antibiotic ointment and a Band-Aid. Can leave uncovered at night Monitor for any signs/symptoms of infection. Call the office immediately if any occur or go directly to the emergency room. Call with any questions/concerns.

## 2014-02-20 NOTE — Progress Notes (Signed)
Patient ID: Nicole Ferrell, female   DOB: 1960-11-25, 53 y.o.   MRN: [id number]  Subjective: Nicole Ferrell returns the x-ray for followup evaluation status post left lateral hallux nail avulsion secondary to paronychia and cellulitis. She states that she's continued with antibiotics. She's there is been decreased redness from around the area. She continues to have some discomfort along the nail border. Denies any purulence or drainage. She has been soaking her foot twice a day followed by antibiotic ointment and a Band-Aid. Denies any systemic complaints as fevers, chills, nausea, vomiting. No other complaints at this time.  Objective: AAO x3, NAD DP/PT pulses palpable bilaterally, CRT less than 3 seconds Protective sensation intact with Simms Weinstein monofilament Left lateral hallux partial nail avulsion with a small amount of dried blood in the proximal border. Decreased erythema. No drainage or purulence identified. Mild tenderness along the lateral  nail border. No ascending cellulitis, fluctuance, crepitus, malodor. No calf pain, swelling, warmth.  Assessment: 53 year old female status post left lateral hallux partial nail avulsion secondary to paronychia with resolving infection  Plan: -Treatment options discussed including alternatives, risks, complications. -Continue soaking her foot twice a day Epson salts all diabetic ointment and a Band-Aid. Can leave uncovered at night. -Finish course of antibiotics -Continue to monitor for any clinical signs or symptoms of worsening infection and directed to call the office immediately if any are to occur or go directly to the emergency room. -Followup in 2 weeks or sooner if any problems are to arise. Call the office with any questions, concerns, change in symptoms.

## 2014-02-22 ENCOUNTER — Ambulatory Visit: Payer: BC Managed Care – PPO | Admitting: Physical Therapy

## 2014-02-23 ENCOUNTER — Ambulatory Visit: Payer: BC Managed Care – PPO | Admitting: Physical Therapy

## 2014-02-23 DIAGNOSIS — Z5189 Encounter for other specified aftercare: Secondary | ICD-10-CM | POA: Diagnosis not present

## 2014-03-03 ENCOUNTER — Ambulatory Visit: Payer: BC Managed Care – PPO | Admitting: Podiatry

## 2014-03-08 ENCOUNTER — Ambulatory Visit: Payer: BC Managed Care – PPO | Attending: Sports Medicine | Admitting: Physical Therapy

## 2014-03-08 ENCOUNTER — Other Ambulatory Visit: Payer: BC Managed Care – PPO

## 2014-03-08 DIAGNOSIS — R5381 Other malaise: Secondary | ICD-10-CM | POA: Diagnosis not present

## 2014-03-08 DIAGNOSIS — M542 Cervicalgia: Secondary | ICD-10-CM | POA: Diagnosis not present

## 2014-03-08 DIAGNOSIS — Z5189 Encounter for other specified aftercare: Secondary | ICD-10-CM | POA: Diagnosis present

## 2014-03-08 DIAGNOSIS — M546 Pain in thoracic spine: Secondary | ICD-10-CM | POA: Insufficient documentation

## 2014-03-08 DIAGNOSIS — R293 Abnormal posture: Secondary | ICD-10-CM | POA: Diagnosis not present

## 2014-03-10 ENCOUNTER — Ambulatory Visit: Payer: BC Managed Care – PPO | Admitting: Physical Therapy

## 2014-03-10 DIAGNOSIS — Z5189 Encounter for other specified aftercare: Secondary | ICD-10-CM | POA: Diagnosis not present

## 2014-03-14 ENCOUNTER — Ambulatory Visit: Payer: BC Managed Care – PPO | Admitting: Physical Therapy

## 2014-03-14 DIAGNOSIS — Z5189 Encounter for other specified aftercare: Secondary | ICD-10-CM | POA: Diagnosis not present

## 2014-03-15 ENCOUNTER — Other Ambulatory Visit: Payer: BC Managed Care – PPO

## 2014-03-17 ENCOUNTER — Ambulatory Visit: Payer: BC Managed Care – PPO | Admitting: Physical Therapy

## 2014-03-17 DIAGNOSIS — Z5189 Encounter for other specified aftercare: Secondary | ICD-10-CM | POA: Diagnosis not present

## 2014-03-21 ENCOUNTER — Ambulatory Visit: Payer: BC Managed Care – PPO | Admitting: Physical Therapy

## 2014-03-21 DIAGNOSIS — Z5189 Encounter for other specified aftercare: Secondary | ICD-10-CM | POA: Diagnosis not present

## 2014-03-22 ENCOUNTER — Other Ambulatory Visit: Payer: BC Managed Care – PPO

## 2014-03-22 ENCOUNTER — Ambulatory Visit (INDEPENDENT_AMBULATORY_CARE_PROVIDER_SITE_OTHER): Payer: BC Managed Care – PPO

## 2014-03-22 DIAGNOSIS — Z13820 Encounter for screening for osteoporosis: Secondary | ICD-10-CM

## 2014-03-23 ENCOUNTER — Ambulatory Visit: Payer: BC Managed Care – PPO | Admitting: Physical Therapy

## 2014-03-27 ENCOUNTER — Ambulatory Visit: Payer: BC Managed Care – PPO | Admitting: Physical Therapy

## 2014-04-07 ENCOUNTER — Ambulatory Visit: Payer: BC Managed Care – PPO | Admitting: Physical Therapy

## 2014-04-12 ENCOUNTER — Ambulatory Visit: Payer: BC Managed Care – PPO | Admitting: Physical Therapy

## 2014-04-14 ENCOUNTER — Ambulatory Visit: Payer: BC Managed Care – PPO | Admitting: Physical Therapy

## 2014-08-25 ENCOUNTER — Ambulatory Visit: Payer: BC Managed Care – PPO | Admitting: Podiatry

## 2014-09-06 ENCOUNTER — Ambulatory Visit (INDEPENDENT_AMBULATORY_CARE_PROVIDER_SITE_OTHER): Payer: 59 | Admitting: Podiatry

## 2014-09-06 ENCOUNTER — Encounter: Payer: Self-pay | Admitting: Podiatry

## 2014-09-06 VITALS — BP 125/71 | HR 79 | Resp 18

## 2014-09-06 DIAGNOSIS — L600 Ingrowing nail: Secondary | ICD-10-CM

## 2014-09-06 NOTE — Patient Instructions (Signed)
Ingrown Toenail An ingrown toenail occurs when the sharp edge of a toenail grows into the skin of the groove beside the nail (lateral nail groove), causing pain. Ingrown toenails most commonly occur on the first (big) toe. If left untreated, an ingrown toenail may become inflamed and infected. The infection can even spread throughout the toe. SYMPTOMS   Pain, centered on the nail groove.  Tenderness and inflammation.  Pus drainage (occasionally).  Signs of infection: redness, swelling, pain, warm to the touch. CAUSES  Ingrown toenails are caused when the toenail grows into the neighboring fleshy nail fold. This may be the result of poor nail trimming or pressure from shoes.  RISK INCREASES WITH:   Curved nail formations.  Clipping toenails too far on the sides, which allows tissue to grow over the nail.  Poorly fitted shoes, especially those that press down on the toenails.  Sports that require sudden stops, causing the toe to jam into the shoe. PREVENTION   Trim toenails properly, making sure the sides are not clipped too short.  Wear properly fitted shoes.  Avoid excessive pressure on the toenails. PROGNOSIS  Ingrown toenails are usually curable within 1 week, depending on the presence or absence of an infection.  RELATED COMPLICATIONS   Chronic infection, that cannot be cured without surgery.  Spread of infection throughout the toe, or even the bone. TREATMENT  Treatment first involves resting from aggravating activities, wearing shoes that do not place pressure on the toenails, antibiotics (if infected), and soaking the foot in warm water (with or without Epsom salt). After the foot has been soaked, you may be able to gently lift the nail from the fleshy tissue, and place a bit of cotton ball under the nail, easing the pressure. If you have been prescribed antibiotics, expect relief to begin up to 48 hours after taking the medicine. Symptoms usually go away within 1 week. For  people who suffer from persistent (chronic) ingrown toenails, surgery may be advised. MEDICATION   If pain medicine is needed, nonsteroidal anti-inflammatory medicines (aspirin and ibuprofen), or other minor pain relievers (acetaminophen), are often advised.  Do not take pain medicine for 7 days before surgery.  Stronger pain relievers may be prescribed, if your caregiver thinks they are needed. Use only as directed and only as much as you need.  Antibiotics may be prescribed to fight infection. Take as directed by your caregiver. Finish the entire prescription, even if you start to feel better before you finish it. SOAKS AND COLD THERAPY   Soak the toe (or whole foot) for 20 minutes, twice a day, in a gallon of warm water. You may add 2 tablespoons of Epsom salts or a mild detergent.  Cold treatment (icing) should be applied for 10 to 15 minutes every 2 to 3 hours for inflammation and pain, and immediately after activity that aggravates your symptoms. Use ice packs or an ice massage. SEEK MEDICAL CARE IF:   Symptoms get worse or do not improve in 48 hours, despite treatment.  You develop fever, increased pain and swelling, or signs of infection (pain, redness, tenderness, swelling, warmth) in the toe, after surgery.  New, unexplained symptoms develop. (Drugs used in treatment may produce side effects.) Document Released: 04/21/2005 Document Revised: 07/14/2011 Document Reviewed: 08/03/2008 [facility] Patient Information 2015 [place], [place]. This information is not intended to replace advice given to you by your health care provider. Make sure you discuss any questions you have with your health care provider.

## 2014-09-07 NOTE — Progress Notes (Signed)
Patient ID: Nicole Ferrell, female   DOB: 1960-08-14, 54 y.o.   MRN: [id number]  Subjective: 54 year old female left the office today for evaluation of her left big toe. She previously underwent partial nail avulsion due to paronychia to the left lateral border. She states that the nail started to grow back in and she wants to help prevent it from becoming ingrown again. Since there is some mild discomfort at the end of the toenail. She denies any surrounding redness, drainage, swelling. No recent injury or trauma. No other complaints at this time.  Objective: AAO x3, NAD DP/PT pulses palpable bilaterally, CRT less than 3 seconds Protective sensation intact with Simms Weinstein monofilament, vibratory sensation intact, Achilles tendon reflex intact Appears at the lateral border left hallux nails to grow back. There is mild incurvation of the nail. There is mild tenderness on the distal aspect of the toenail how there is no tenderness on the proximal nail border. There is no swelling edema, erythema, drainage/purulence. No areas of tenderness to bilateral lower extremities. MMT 5/5, ROM WNL.  No open lesions or pre-ulcerative lesions.  No overlying edema, erythema, increase in warmth to bilateral lower extremities.  No pain with calf compression, swelling, warmth, erythema bilaterally.   Assessment: 54 year old female follow evaluation left lateral hallux ingrown toenail  Plan: -Treatment options were discussed with the patient include alternatives, risks, competitions. -The nail was lightly debrided without complication/bleeding. -Discussed ways with the patient to help prevent ingrowing toenails. -I discussed that if the nail remains symptomatic or increases in pain or fitting signs of infection she been in the nail removed again. I recommended doing this before infection in order to do a Corporate investment banker. She is very hasn't to perform this. Discussed with her that if she would like  weakness of the surgery center under sedation. She'll consider her options. -Follow-up as needed. Call the office with any question, concerns, changes symptoms.

## 2015-05-08 DIAGNOSIS — E039 Hypothyroidism, unspecified: Secondary | ICD-10-CM | POA: Diagnosis not present

## 2015-05-08 DIAGNOSIS — E559 Vitamin D deficiency, unspecified: Secondary | ICD-10-CM | POA: Diagnosis not present

## 2015-05-08 DIAGNOSIS — D72820 Lymphocytosis (symptomatic): Secondary | ICD-10-CM | POA: Diagnosis not present

## 2015-05-08 DIAGNOSIS — B2790 Infectious mononucleosis, unspecified without complication: Secondary | ICD-10-CM | POA: Diagnosis not present

## 2015-05-08 DIAGNOSIS — R6882 Decreased libido: Secondary | ICD-10-CM | POA: Diagnosis not present

## 2015-05-08 DIAGNOSIS — N951 Menopausal and female climacteric states: Secondary | ICD-10-CM | POA: Diagnosis not present

## 2015-05-08 DIAGNOSIS — B259 Cytomegaloviral disease, unspecified: Secondary | ICD-10-CM | POA: Diagnosis not present

## 2015-05-08 DIAGNOSIS — R5381 Other malaise: Secondary | ICD-10-CM | POA: Diagnosis not present

## 2015-06-04 MED FILL — LEVOCETIRIZINE 5 MG TABLET: 5 | 90 days supply | Qty: 90 | Fill #2

## 2015-06-19 DIAGNOSIS — F909 Attention-deficit hyperactivity disorder, unspecified type: Secondary | ICD-10-CM | POA: Diagnosis not present

## 2015-06-20 MED FILL — ADDERALL 10 MG TABLET: 10 | 30 days supply | Qty: 90 | Fill #0

## 2015-07-02 MED FILL — ESTRADIOL 1 MG TABLET: 1 | 90 days supply | Qty: 180 | Fill #2

## 2015-07-03 MED FILL — NATURE-THROID 97.5 MG TAB: 97.5 | 90 days supply | Qty: 90 | Fill #2

## 2015-07-13 ENCOUNTER — Ambulatory Visit: Payer: BC Managed Care – PPO | Admitting: Sports Medicine

## 2015-07-24 DIAGNOSIS — H524 Presbyopia: Secondary | ICD-10-CM | POA: Diagnosis not present

## 2015-07-24 DIAGNOSIS — H5213 Myopia, bilateral: Secondary | ICD-10-CM | POA: Diagnosis not present

## 2015-07-24 DIAGNOSIS — H52222 Regular astigmatism, left eye: Secondary | ICD-10-CM | POA: Diagnosis not present

## 2015-07-26 MED FILL — PROGESTERONE 200 MG CAPSULE: 200 | 90 days supply | Qty: 180 | Fill #0 | Status: TO

## 2015-09-03 ENCOUNTER — Ambulatory Visit: Payer: 59 | Admitting: Podiatry

## 2015-09-07 MED FILL — LEVOCETIRIZINE 5 MG TABLET: 5 | 90 days supply | Qty: 90 | Fill #0 | Status: TO

## 2015-09-13 DIAGNOSIS — B2790 Infectious mononucleosis, unspecified without complication: Secondary | ICD-10-CM | POA: Diagnosis not present

## 2015-09-13 DIAGNOSIS — R5381 Other malaise: Secondary | ICD-10-CM | POA: Diagnosis not present

## 2015-09-13 DIAGNOSIS — B259 Cytomegaloviral disease, unspecified: Secondary | ICD-10-CM | POA: Diagnosis not present

## 2015-09-13 DIAGNOSIS — E039 Hypothyroidism, unspecified: Secondary | ICD-10-CM | POA: Diagnosis not present

## 2015-09-13 DIAGNOSIS — N951 Menopausal and female climacteric states: Secondary | ICD-10-CM | POA: Diagnosis not present

## 2015-09-13 DIAGNOSIS — E559 Vitamin D deficiency, unspecified: Secondary | ICD-10-CM | POA: Diagnosis not present

## 2015-09-17 ENCOUNTER — Ambulatory Visit (INDEPENDENT_AMBULATORY_CARE_PROVIDER_SITE_OTHER): Payer: 59 | Admitting: Podiatry

## 2015-09-17 ENCOUNTER — Encounter: Payer: Self-pay | Admitting: Podiatry

## 2015-09-17 VITALS — BP 117/76 | HR 70 | Resp 18

## 2015-09-17 DIAGNOSIS — M722 Plantar fascial fibromatosis: Secondary | ICD-10-CM | POA: Diagnosis not present

## 2015-09-17 DIAGNOSIS — L600 Ingrowing nail: Secondary | ICD-10-CM

## 2015-09-17 NOTE — Patient Instructions (Signed)

## 2015-09-20 NOTE — Progress Notes (Signed)
Patient ID: Nicole Ferrell, female   DOB: [birth year], 55 y.o.   MRN: [id number]  Subjective: 55 year old female presents the office today discussed surgical intervention for her left big toe ingrowing. She states that the nail started to come back ingrown incurvated and she would proceed with surgery to remove permanent nail corners. She says there is are painful with pressure in shoe gear. Denies any redness, swelling, drainage or pus. She has a states that the cage that she gets some pain in the arch of her foot for which she points the medial band of plantar fascia and along the heel when doing a lot of walking at work. She says the pain is crawling on bothering her and she did discuss orthotics. Denies any systemic complaints such as fevers, chills, nausea, vomiting. No acute changes since last appointment, and no other complaints at this time.   Objective: AAO x3, NAD DP/PT pulses palpable bilaterally, CRT less than 3 seconds Protective sensation intact with Simms Weinstein monofilament Incurvation of both the medial and lateral aspect the left hallux toenail tenderness palpation around the nail borders. No redness, drainage or swelling. Mild incurvation the other toenails however there not symptomatic at this time. There is really no tenderness to palpation on the plantar medial tubercle of the calcaneus at insertion upon her fascia on the plantar fashion the arch of the foot however subjectively she does get pain to this area times with working. No area pinpoint any tenderness there is no pain vibratory sensation.  MMT 5/5, ROM WNL. No edema, erythema, increase in warmth to bilateral lower extremities.  No open lesions or pre-ulcerative lesions.  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  -At this time should proceed with intervention to her permanent removed both the medial lateral nail borders the left hallux toenail. However she will do this at the surgery Center under sedation. The incision placement as well as the postoperative course was discussed with the patient. I discussed risks of the surgery which include, but not limited to, infection, bleeding, pain, swelling, need for further surgery, delayed or nonhealing, painful or ugly scar, numbness or sensation changes, over/under correction, recurrence, transfer lesions, further deformity, hardware failure, DVT/PE, loss of toe/foot. Patient understands these risks and wishes to proceed with surgery. The surgical consent was reviewed with the patient all 3 pages were signed. No promises or guarantees were given to the outcome of the procedure. All questions were answered to the best of my ability. Before the surgery the patient was encouraged to call the office if there is any further questions. The surgery will be performed at the [facility] on an outpatient basis. -She was also scanned for orthotics were sent to [facility] labs. Discussed shoe gear modifications as well.  Celesta Gentile, DPM

## 2015-09-21 MED FILL — ADDERALL 10 MG TABLET: 10 | 30 days supply | Qty: 90 | Fill #0

## 2015-09-26 MED FILL — VALACYCLOVIR HCL 500 MG TAB: 500 | 21 days supply | Qty: 42 | Fill #0

## 2015-09-27 MED FILL — NATURE-THROID 113.75 MG TAB: 113.75 | 90 days supply | Qty: 90 | Fill #0 | Status: TO

## 2015-10-08 MED FILL — ESTRADIOL 1 MG TABLET: 1 | 90 days supply | Qty: 180 | Fill #3

## 2015-10-24 ENCOUNTER — Ambulatory Visit: Payer: 59 | Admitting: *Deleted

## 2015-10-24 DIAGNOSIS — M722 Plantar fascial fibromatosis: Secondary | ICD-10-CM

## 2015-10-24 NOTE — Patient Instructions (Signed)

## 2015-10-24 NOTE — Progress Notes (Signed)
Patient ID: Nicole Ferrell, female   DOB: 12/15/1960, 55 y.o.   MRN: [id number] Patient presents for orthotic pick up.  Verbal and written break in and wear instructions given.  Patient will follow up in 4 weeks if symptoms worsen or fail to improve.

## 2015-11-29 ENCOUNTER — Ambulatory Visit: Payer: BC Managed Care – PPO | Admitting: Sports Medicine

## 2016-01-08 MED FILL — ADDERALL 10 MG TABLET: 10 | 30 days supply | Qty: 90 | Fill #0

## 2018-04-09 MED FILL — METHYLPHENIDATE HCL 5 MG TA: 5 | 30 days supply | Qty: 60 | Fill #0

## 2019-08-10 ENCOUNTER — Other Ambulatory Visit: Payer: Self-pay | Admitting: Family Medicine

## 2019-08-10 DIAGNOSIS — E039 Hypothyroidism, unspecified: Secondary | ICD-10-CM

## 2019-08-26 ENCOUNTER — Ambulatory Visit
Admission: RE | Admit: 2019-08-26 | Discharge: 2019-08-26 | Disposition: A | Discharge status: Home / Self Care | Payer: BC Managed Care – PPO | Source: Ambulatory Visit | Attending: Family Medicine | Admitting: Family Medicine

## 2019-08-26 DIAGNOSIS — E039 Hypothyroidism, unspecified: Secondary | ICD-10-CM

## 2019-09-12 ENCOUNTER — Encounter (INDEPENDENT_AMBULATORY_CARE_PROVIDER_SITE_OTHER): Payer: BC Managed Care – PPO | Admitting: Ophthalmology

## 2019-09-26 ENCOUNTER — Encounter (INDEPENDENT_AMBULATORY_CARE_PROVIDER_SITE_OTHER): Payer: BC Managed Care – PPO | Admitting: Ophthalmology

## 2019-09-26 ENCOUNTER — Other Ambulatory Visit: Payer: Self-pay

## 2019-09-26 DIAGNOSIS — H35343 Macular cyst, hole, or pseudohole, bilateral: Secondary | ICD-10-CM

## 2019-09-26 DIAGNOSIS — H43813 Vitreous degeneration, bilateral: Secondary | ICD-10-CM

## 2019-09-26 DIAGNOSIS — H5213 Myopia, bilateral: Secondary | ICD-10-CM

## 2019-10-21 ENCOUNTER — Ambulatory Visit: Payer: BC Managed Care – PPO | Admitting: Sports Medicine

## 2019-10-24 ENCOUNTER — Ambulatory Visit (INDEPENDENT_AMBULATORY_CARE_PROVIDER_SITE_OTHER): Payer: BC Managed Care – PPO | Admitting: Sports Medicine

## 2019-10-24 ENCOUNTER — Encounter: Payer: Self-pay | Admitting: Sports Medicine

## 2019-10-24 DIAGNOSIS — M65311 Trigger thumb, right thumb: Secondary | ICD-10-CM

## 2019-10-24 MED ORDER — MELOXICAM 15 MG PO TABS: ORAL_TABLET | ORAL | 3 refills | Status: AC

## 2019-10-24 NOTE — Assessment & Plan Note (Signed)
This is a pleasant 59 year old female, for the past several weeks she is noted pain in the volar aspect of her right thumb with locking of the interphalangeal joint and flexion. Worse at night and in the morning. On exam she has a tender flexor pollicis longus sheath, palpable tendon nodule, all consistent with trigger thumb. She does desire conservative approach so we will start with meloxicam, home rehab exercises, return to see me in 4 weeks, flexor pollicis longus tendon sheath injection if no better.

## 2019-10-24 NOTE — Progress Notes (Signed)
° ° °  Procedures performed today:    None.  Independent interpretation of notes and tests performed by another provider:   None.  Brief History, Exam, Impression, and Recommendations:    Trigger thumb, right thumb This is a pleasant 59 year old female, for the past several weeks she is noted pain in the volar aspect of her right thumb with locking of the interphalangeal joint and flexion. Worse at night and in the morning. On exam she has a tender flexor pollicis longus sheath, palpable tendon nodule, all consistent with trigger thumb. She does desire conservative approach so we will start with meloxicam, home rehab exercises, return to see me in 4 weeks, flexor pollicis longus tendon sheath injection if no better.    ___________________________________________ Gwen Her. Dianah Field, M.D., ABFM., CAQSM. Primary Care and [place] Instructor of [place] [facility]

## 2019-11-21 ENCOUNTER — Ambulatory Visit: Payer: BC Managed Care – PPO | Admitting: Sports Medicine

## 2019-12-05 ENCOUNTER — Ambulatory Visit: Payer: BC Managed Care – PPO | Admitting: Sports Medicine

## 2022-02-26 IMAGING — US US THYROID
1 series · 14 of 25 positions shown · non-contrast
Comparison: None.

CLINICAL DATA: Hypothyroidism

EXAM:
THYROID ULTRASOUND
TECHNIQUE: Ultrasound examination of the thyroid gland and adjacent soft
tissues was performed.

[Series 1: us thyroid · 0.04mm/px · 14 of 44 slices shown]
[im 1/44]
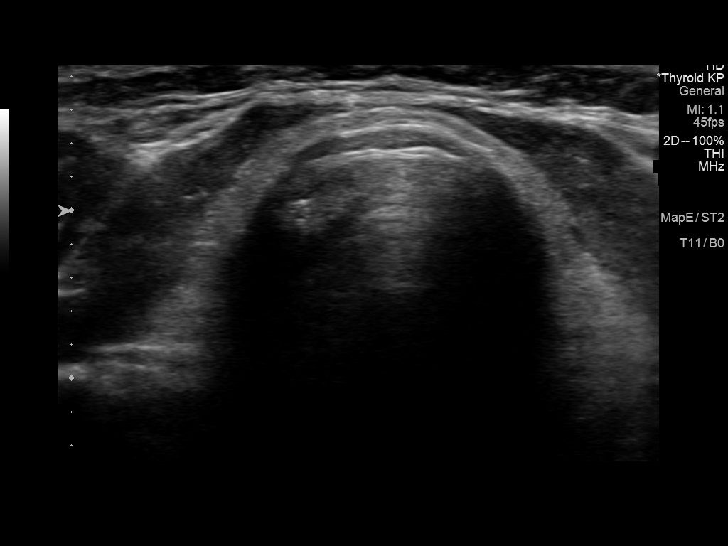
[im 4/44]
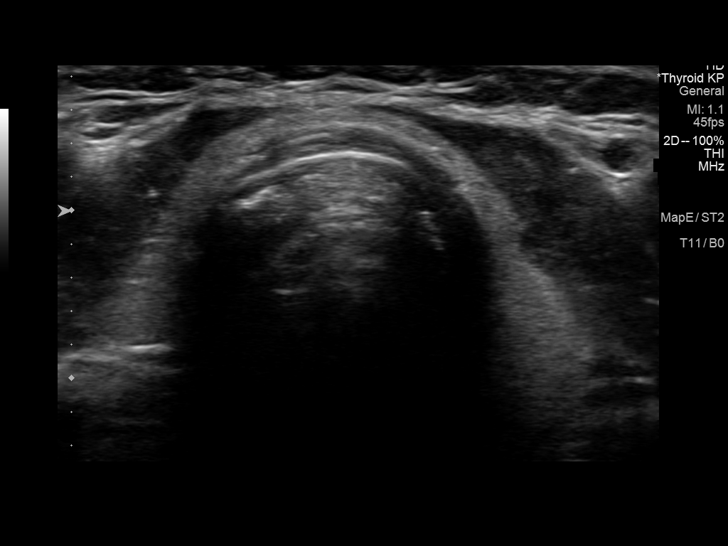
[im 8/44]
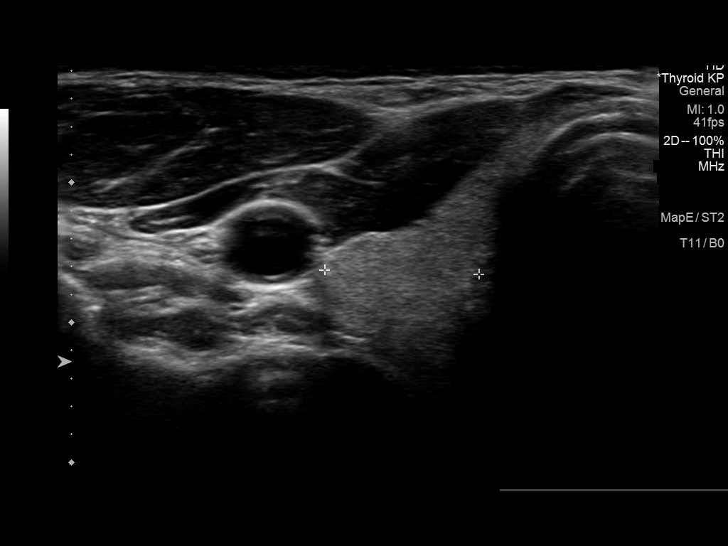
[im 11/44]
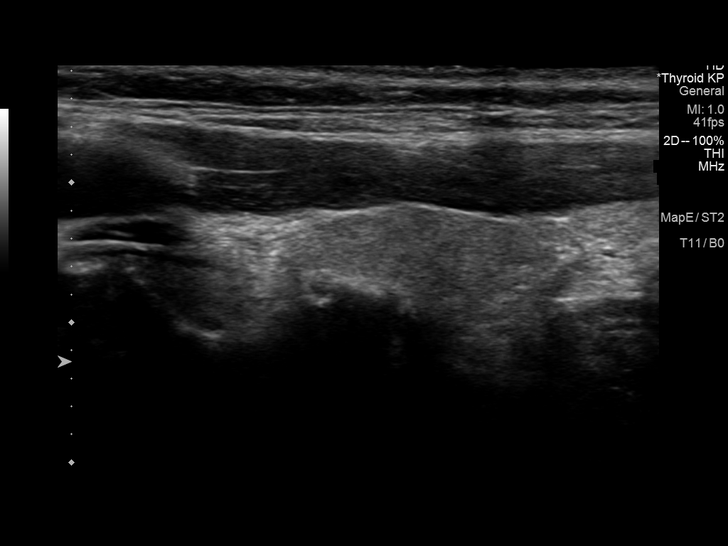
[im 15/44]
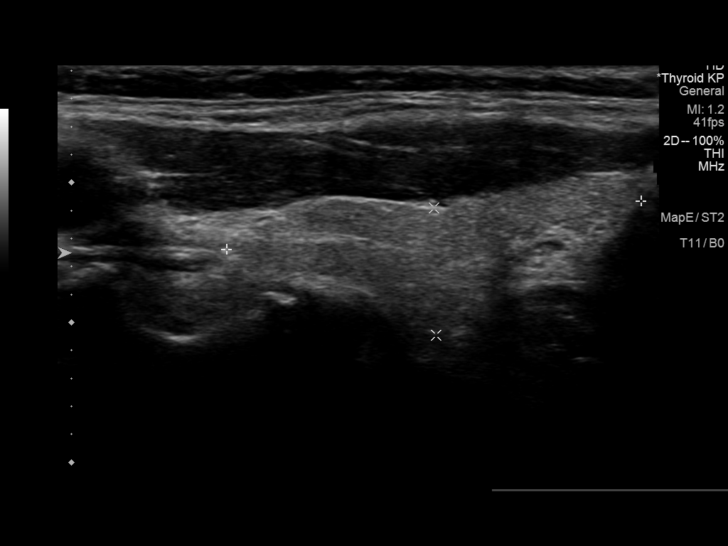
[im 17/44]
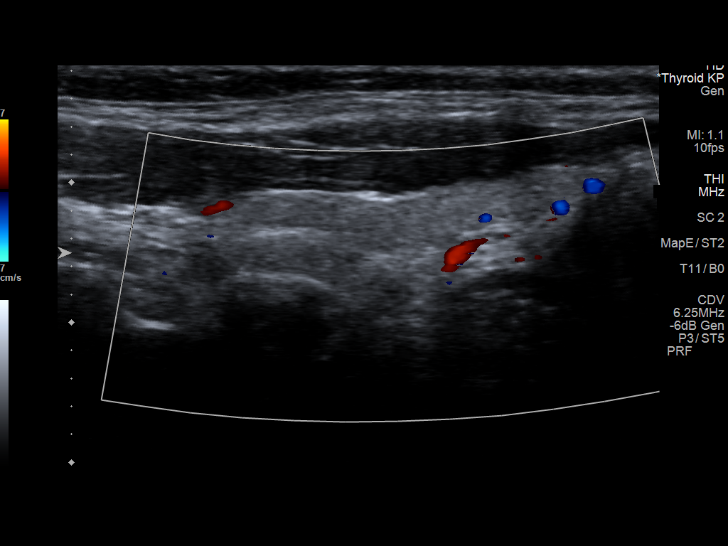
[im 20/44]
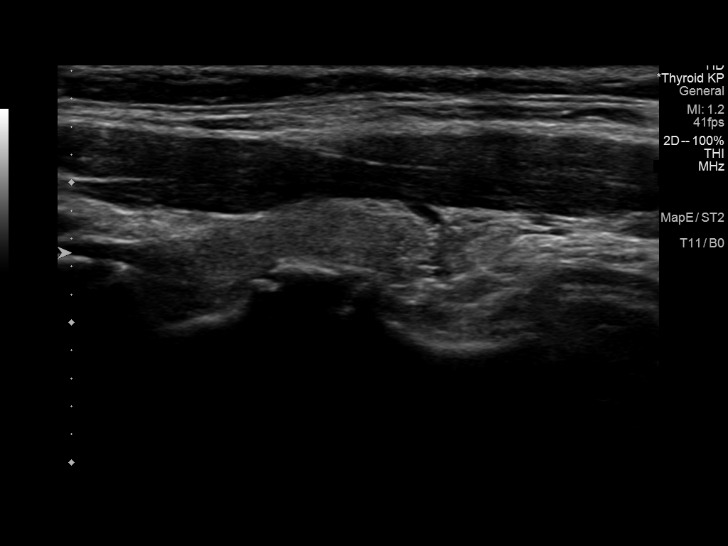
[im 24/44]
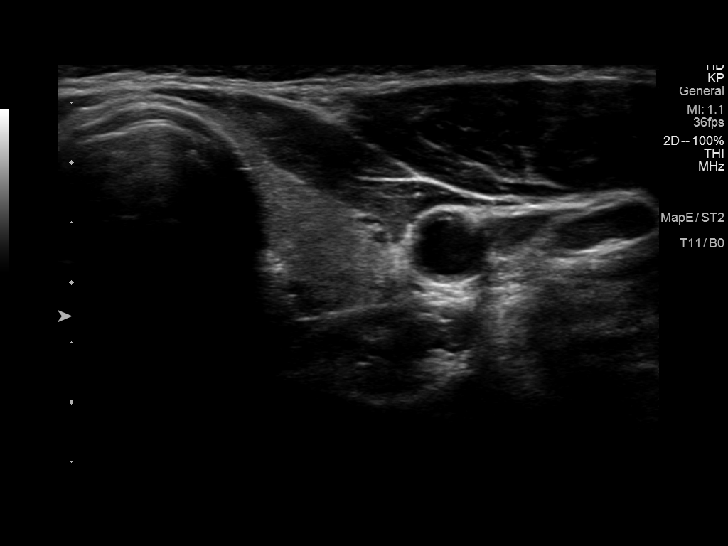
[im 27/44]
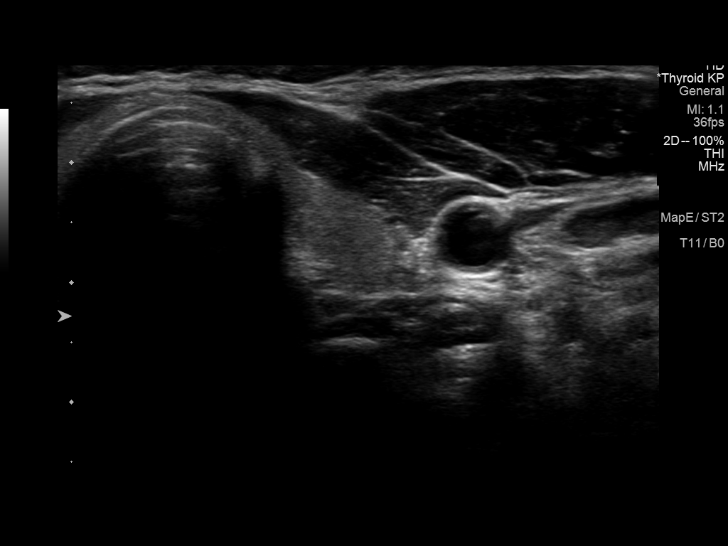
[im 29/44]
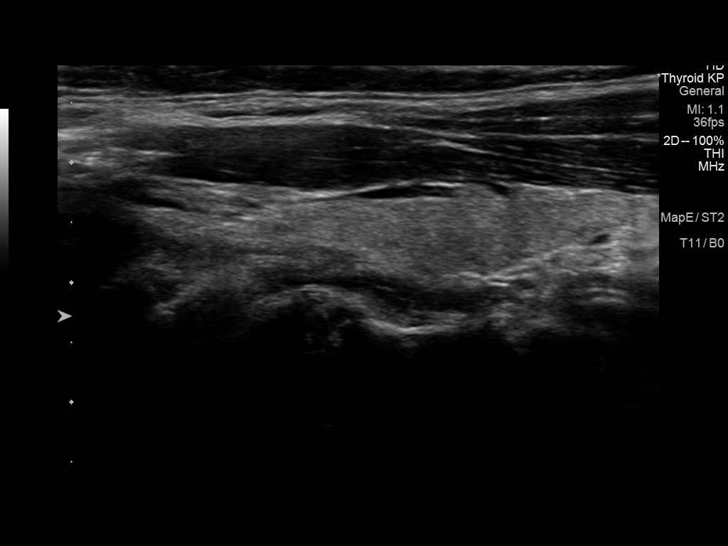
[im 33/44]
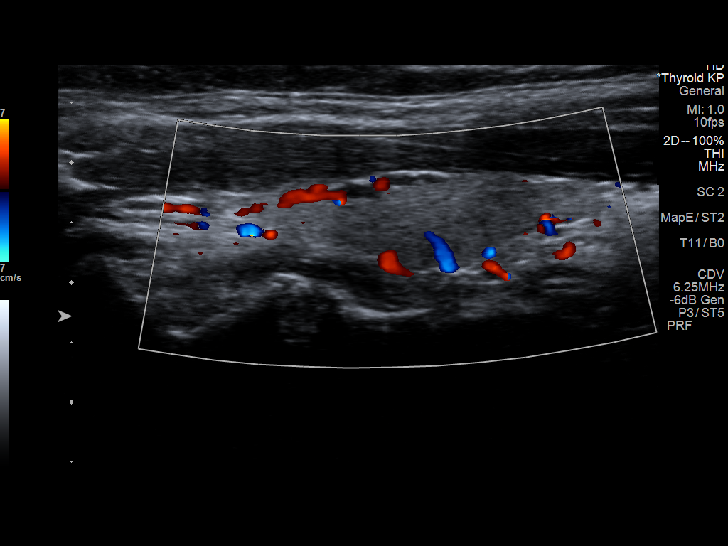
[im 36/44]
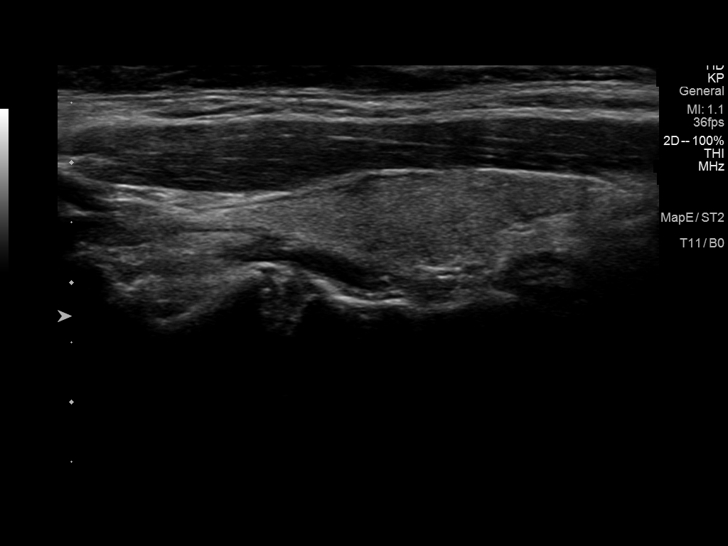
[im 40/44]
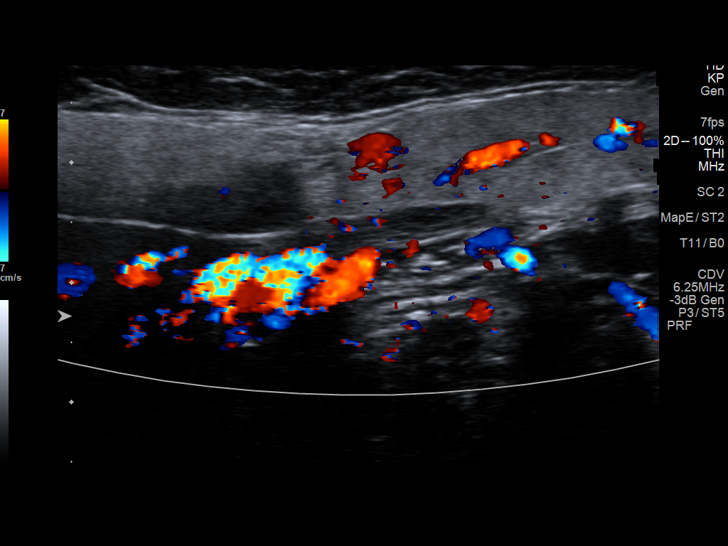
[im 44/44]
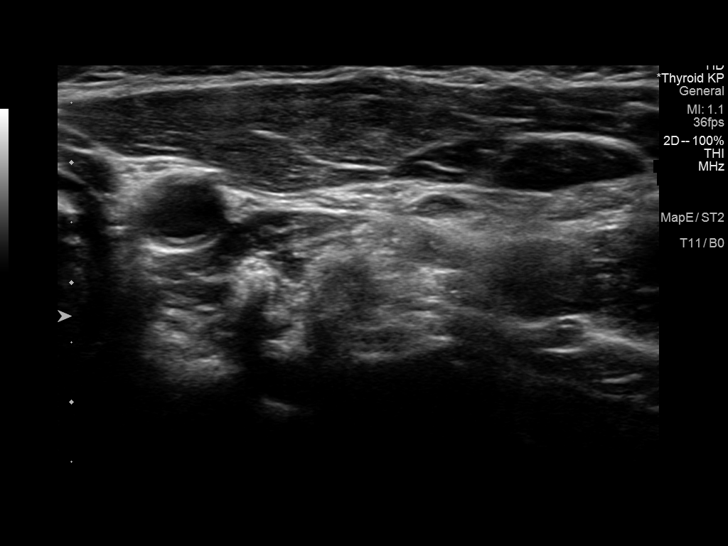

[14 of 25 positions shown; findings below may reference images not displayed]

FINDINGS: Parenchymal Echotexture: Normal

Isthmus: 1 mm

Right lobe: 3.0 x 0.9 x 1.1 cm

Left lobe: 3.2 x 0.7 x 1.0 cm

_________________________________________________________

Estimated total number of nodules >/= 1 cm: 0

Number of spongiform nodules >/=  2 cm not described below (TR1): 0

Number of mixed cystic and solid nodules >/= 1.5 cm not described
below (TR2): 0

_________________________________________________________

No discrete nodules are seen within the thyroid gland.
IMPRESSION: Normal thyroid ultrasound for age

The above is in keeping with the ACR TI-RADS recommendations - [HOSPITAL] 3385;[DATE].

## 2024-01-20 NOTE — Progress Notes (Deleted)
    Ben Jackson [facility] [address] [place] [address] Phone: [phone]   Assessment and Plan:     ***    Pertinent previous records reviewed include ***   Follow Up: ***     Subjective:   I, Demoni Parmar, am serving as a Neurosurgeon for Doctor Morene Mace  Chief Complaint: low back pain   HPI:   01/21/2024 Patient is a 63 year old female with low back pain. Patient states   Relevant Historical Information: ***  Additional pertinent review of systems negative.   Current Outpatient Medications:    estradiol  (ESTRACE ) 1 MG tablet, Take 1 mg by mouth., Disp: , Rfl:    ibuprofen (ADVIL,MOTRIN) 600 MG tablet, , Disp: , Rfl: 0   LORazepam  (ATIVAN ) 0.5 MG tablet, TAKE 1 TABLET BY MOUTH TWICE DAILY AS NEEDED (Patient not taking: Reported on 10/24/2019), Disp: 60 tablet, Rfl: 0   meloxicam  (MOBIC ) 15 MG tablet, One tab PO qAM with a meal for 2 weeks, then daily prn pain., Disp: 30 tablet, Rfl: 3   methylphenidate (RITALIN) 5 MG tablet, Take 5 mg by mouth 2 (two) times daily with breakfast and lunch., Disp: , Rfl:    Nutritional Supplements (HRT SUPPORT PO), Take by mouth., Disp: , Rfl:    Progesterone  400 MG SUPP, Place vaginally., Disp: , Rfl:    Testosterone  20.25 MG/ACT (1.62%) GEL, Place onto the skin., Disp: , Rfl:    Thyroid  113.75 MG TABS, Take by mouth., Disp: , Rfl:    Objective:     There were no vitals filed for this visit.    There is no height or weight on file to calculate BMI.    Physical Exam:    ***   Electronically signed by:  Odis Mace [facility] 7:50 AM 01/20/24

## 2024-01-21 ENCOUNTER — Ambulatory Visit: Admitting: Sports Medicine

## 2024-01-27 NOTE — Progress Notes (Unsigned)
 Ben Jackson D[facility] [address] [place] [address] Phone: [phone]   Assessment and Plan:     1. Chronic left-sided low back pain without sciatica (Primary) 2. Chronic left hip pain 3. Sacral pain -Chronic with exacerbation, initial visit - Left lower back, posterior left hip pain intermittent for 7+ years, worsening after fall onto tailbone ~1 month ago.  Most consistent with mild degenerative changes in lumbar spine and musculoskeletal dysfunction leading to intermittent chronic pain - X-ray obtained in clinic.  My interpretation: No acute fracture, vertebral collapse, dislocation.  Mild facet arthrosis of lower lumbar spine.  Mild acetabular spurring bilateral - Start HEP and physical therapy for low back, hip  - Start extubated milligrams twice daily x2 weeks.  If still having pain after 2 weeks, complete 3rd-week of NSAID. May use remaining NSAID as needed once daily for pain control.  Do not to use additional over-the-counter NSAIDs (ibuprofen, naproxen, Advil, Aleve, etc.) while taking prescription NSAIDs.  May use Tylenol [phone] mg 2 to 3 times a day for breakthrough pain.   15 additional minutes spent for educating Therapeutic Home Exercise Program.  This included exercises focusing on stretching, strengthening, with focus on eccentric aspects.   Long term goals include an improvement in range of motion, strength, endurance as well as avoiding reinjury. Patient's frequency would include in 1-2 times a day, 3-5 times a week for a duration of 6-12 weeks. Proper technique shown and discussed handout in great detail with ATC.  All questions were discussed and answered.    Pertinent previous records reviewed include none   Follow Up: 6 to 8 weeks for reevaluation.  Could consider discussing OMT versus advanced imaging   Subjective:   I, Sybilla Malhotra, am serving as a Neurosurgeon for Doctor Morene Mace  Chief Complaint: low back  pain   HPI:   01/28/2024 Patient is a 63 year old female with low back pain. Patient states pain for 7 years she was gardening and felt a pop. She now has intermittent pain that flares when she does certain activities. A couple of weeks ago she was dancing on a floor that was rocky, she was then bending over and landed on her tail bone. That pain is on the left side. She is also complaining of right sided shoulder pain, decreased ROM. She was using her friends flexeril patches and those help. She has position here body in a certain way to relief stress    Relevant Historical Information: None pertinent  Additional pertinent review of systems negative.   Current Outpatient Medications:    estradiol  (ESTRACE ) 1 MG tablet, Take 1 mg by mouth., Disp: , Rfl:    meloxicam  (MOBIC ) 15 MG tablet, One tab PO qAM with a meal for 2 weeks, then daily prn pain., Disp: 30 tablet, Rfl: 3   methylphenidate (RITALIN) 5 MG tablet, Take 5 mg by mouth 2 (two) times daily with breakfast and lunch., Disp: , Rfl:    Nutritional Supplements (HRT SUPPORT PO), Take by mouth., Disp: , Rfl:    Progesterone  400 MG SUPP, Place vaginally., Disp: , Rfl:    Testosterone  20.25 MG/ACT (1.62%) GEL, Place onto the skin., Disp: , Rfl:    Thyroid  113.75 MG TABS, Take by mouth., Disp: , Rfl:    Objective:     Vitals:   01/28/24 0819  Pulse: 87  SpO2: 97%  Weight: 135 lb (61.2 kg)  Height: 5' 4 (1.626 m)  Body mass index is 23.17 kg/m.    Physical Exam:    Gen: Appears well, nad, nontoxic and pleasant Psych: Alert and oriented, appropriate mood and affect Neuro: sensation intact, strength is 5/5 in upper and lower extremities, muscle tone wnl Skin: no susupicious lesions or rashes  Back - Normal skin, Spine with normal alignment and no deformity.     tenderness to L2-L4 vertebral process palpation.   Left lumbar paraspinous muscles are   tender and without spasm TTP left sacral base, coccyx NTTP gluteal  musculature Straight leg raise negative Trendelenberg negative Piriformis Test negative Gait normal   Left hip: No deformity, swelling or wasting ROM Flexion 90, ext 30, IR 45, ER 45 NTTP over the hip flexors, greater trochanter,  Negative log roll with FROM Negative FABER Negative FADIR Negative Piriformis test     Electronically signed by:  Odis Mace [facility] 8:49 AM 01/28/24

## 2024-01-28 ENCOUNTER — Ambulatory Visit: Admitting: Sports Medicine

## 2024-01-28 ENCOUNTER — Ambulatory Visit

## 2024-01-28 VITALS — HR 87 | Ht 64.0 in | Wt 135.0 lb

## 2024-01-28 DIAGNOSIS — M533 Sacrococcygeal disorders, not elsewhere classified: Secondary | ICD-10-CM

## 2024-01-28 DIAGNOSIS — G8929 Other chronic pain: Secondary | ICD-10-CM

## 2024-01-28 DIAGNOSIS — M25552 Pain in left hip: Secondary | ICD-10-CM

## 2024-01-28 DIAGNOSIS — M545 Low back pain, unspecified: Secondary | ICD-10-CM

## 2024-01-28 MED ORDER — CELECOXIB 200 MG PO CAPS: 200.0000 mg | ORAL_CAPSULE | Freq: Two times a day (BID) | ORAL | 0 refills | Status: AC

## 2024-01-28 NOTE — Patient Instructions (Addendum)
-   Start celebrex  200 mg daily x2 weeks.  If still having pain after 2 weeks, complete 3rd-week of NSAID. May use remaining NSAID as needed once daily for pain control.  Do not to use additional over-the-counter NSAIDs (ibuprofen, naproxen, Advil, Aleve, etc.) while taking prescription NSAIDs.  May use Tylenol [phone] mg 2 to 3 times a day for breakthrough pain.  Back and glutes HEP Physical therapy with Dr. Elaine. Follow up in 6 to 8 weeks.

## 2024-02-02 ENCOUNTER — Ambulatory Visit: Payer: Self-pay | Admitting: Sports Medicine
